# Patient Record
Sex: Female | Born: 2005 | Race: Black or African American | Hispanic: No | Marital: Single
Health system: Southern US, Community
[De-identification: ages and names within clinical notes are randomized; demographics above are authoritative.]

## PROBLEM LIST (undated history)

## (undated) DIAGNOSIS — Z889 Allergy status to unspecified drugs, medicaments and biological substances status: Secondary | ICD-10-CM

## (undated) DIAGNOSIS — L309 Dermatitis, unspecified: Secondary | ICD-10-CM

---
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
## 2023-04-18 ENCOUNTER — Ambulatory Visit (HOSPITAL_COMMUNITY)
Admission: EM | Admit: 2023-04-18 | Discharge: 2023-04-18 | Disposition: A | Discharge status: Home / Self Care | Payer: Medicaid Other | Attending: Family Medicine | Admitting: Family Medicine

## 2023-04-18 ENCOUNTER — Encounter (HOSPITAL_COMMUNITY): Payer: Self-pay | Admitting: Emergency Medicine

## 2023-04-18 ENCOUNTER — Other Ambulatory Visit: Payer: Self-pay

## 2023-04-18 DIAGNOSIS — L309 Dermatitis, unspecified: Secondary | ICD-10-CM | POA: Diagnosis not present

## 2023-04-18 DIAGNOSIS — G5601 Carpal tunnel syndrome, right upper limb: Secondary | ICD-10-CM | POA: Diagnosis not present

## 2023-04-18 NOTE — ED Provider Notes (Signed)
[facility]    CSN: [id number] Arrival date & time: 04/18/23  1229      History   Chief Complaint Chief Complaint  Patient presents with   Hand Pain    HPI Pamela Barr is a 17 y.o. female.    Hand Pain  Here for numbness and tingling in her right hand.  She woke up about 3 hours ago and had numbness in her right hand, and it did not include her right pinky.  Then pretty quickly it started improving and she had some intense tingling.  That is improving also and is now at about a 1 out of 10.  Sensation is improved.  No fever or rash no trauma.  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  She does not have a PCP currently    Past Medical History:  Diagnosis Date   Eczema    Multiple allergies     There are no problems to display for this patient.   History reviewed. No pertinent surgical history.  OB History   No obstetric history on file.      Home Medications    Prior to Admission medications   Medication Sig Start Date End Date Taking? Authorizing Provider  ferrous sulfate 325 (65 FE) MG tablet Take 1 tablet (325 mg total) by mouth daily. 09/30/21   Niel Hummer, MD  fluticasone (FLONASE) 50 MCG/ACT nasal spray Place 1 spray into both nostrils daily. 11/29/21   Haskins, Jaclyn Prime, NP  Loratadine 5 MG TBDP Take 1 tablet (5 mg total) by mouth daily. 01/02/12   Palumbo, April, MD    Family History Family History  Problem Relation Age of Onset   Diabetes Other    Cancer Neg Hx    Heart failure Neg Hx    Hyperlipidemia Neg Hx     Social History Social History   Tobacco Use   Smoking status: Passive Smoke Exposure - Never Smoker  Substance Use Topics   Alcohol use: No   Drug use: No     Allergies   Patient has no known allergies.   Review of Systems Review of Systems   Physical Exam Triage Vital Signs ED Triage Vitals  Encounter Vitals Group     BP  04/18/23 1319 (!) 99/53     Systolic BP Percentile --      Diastolic BP Percentile --      Pulse Rate 04/18/23 1319 67     Resp 04/18/23 1319 18     Temp 04/18/23 1319 98.5 F (36.9 C)     Temp Source 04/18/23 1319 Oral     SpO2 04/18/23 1319 97 %     Weight --      Height --      Head Circumference --      Peak Flow --      Pain Score 04/18/23 1321 1     Pain Loc --      Pain Education --      Exclude from Growth Chart --    No data found.  Updated Vital Signs BP (!) 99/53 (BP Location: Left Arm)   Pulse 67   Temp 98.5 F (36.9 C) (Oral)   Resp 18   LMP 04/13/2023   SpO2 97%   Visual Acuity Right Eye Distance:   Left Eye Distance:   Bilateral Distance:    Right Eye Near:  Left Eye Near:    Bilateral Near:     Physical Exam Vitals reviewed.  Constitutional:      General: She is not in acute distress.    Appearance: She is not ill-appearing, toxic-appearing or diaphoretic.  Eyes:     Extraocular Movements: Extraocular movements intact.     Pupils: Pupils are equal, round, and reactive to light.  Cardiovascular:     Rate and Rhythm: Normal rate and regular rhythm.     Heart sounds: No murmur heard. Pulmonary:     Effort: Pulmonary effort is normal.     Breath sounds: Normal breath sounds.  Musculoskeletal:     Cervical back: Neck supple.  Lymphadenopathy:     Cervical: No cervical adenopathy.  Skin:    Coloration: Skin is not pale.  Neurological:     Mental Status: She is alert and oriented to person, place, and time.     Comments: Only neurologic deficit is some very slight decreased light touch sensation over the right palm over the median distribution.  Grip is normal  Psychiatric:        Behavior: Behavior normal.      UC Treatments / Results  Labs (all labs ordered are listed, but only abnormal results are displayed) Labs Reviewed - No data to display  EKG   Radiology No results found.  Procedures Procedures (including critical care  time)  Medications Ordered in UC Medications - No data to display  Initial Impression / Assessment and Plan / UC Course  I have reviewed the triage vital signs and the nursing notes.  Pertinent labs & imaging results that were available during my care of the patient were reviewed by me and considered in my medical decision making (see chart for details).        I discussed with her that I think this is mild carpal tunnel syndrome she is experiencing.  Wrist brace is applied and she will wear it at night when asleep.  Also staff will help her in setting up primary care appointment  She is given contact information for dermatology Final Clinical Impressions(s) / UC Diagnoses   Final diagnoses:  Carpal tunnel syndrome of right wrist  Eczema, unspecified type     Discharge Instructions      Wear the wrist brace at night while you are sleeping  Follow-up with primary care about this issue       ED Prescriptions   None    PDMP not reviewed this encounter.   Zenia Resides, MD 04/18/23 [phone]

## 2023-04-18 NOTE — ED Triage Notes (Signed)
Patient presents to [facility] for evaluation of right hand numbness and pain this morning upon waking that lasted approximately 3 hours, but has subsided while waiting.

## 2023-04-18 NOTE — Discharge Instructions (Signed)
Wear the wrist brace at night while you are sleeping  Follow-up with primary care about this issue

## 2023-05-08 ENCOUNTER — Ambulatory Visit (HOSPITAL_COMMUNITY)
Admission: EM | Admit: 2023-05-08 | Discharge: 2023-05-08 | Disposition: A | Discharge status: Home / Self Care | Payer: Medicaid Other | Attending: Family Medicine | Admitting: Family Medicine

## 2023-05-08 ENCOUNTER — Encounter (HOSPITAL_COMMUNITY): Payer: Self-pay | Admitting: Emergency Medicine

## 2023-05-08 DIAGNOSIS — R21 Rash and other nonspecific skin eruption: Secondary | ICD-10-CM

## 2023-05-08 MED ORDER — CLOTRIMAZOLE-BETAMETHASONE 1-0.05 % EX CREA: TOPICAL_CREAM | CUTANEOUS | 0 refills | Status: DC

## 2023-05-08 NOTE — ED Provider Notes (Signed)
[facility]    CSN: [id number] Arrival date & time: 05/08/23  1952      History   Chief Complaint Chief Complaint  Patient presents with   Rash    HPI Pamela Barr is a 17 y.o. female.    Rash  Here for rash and itching on her abdomen, left shoulder and mid back.  Is been bothering her for a few days  No fever Past Medical History:  Diagnosis Date   Eczema    Multiple allergies     There are no problems to display for this patient.   History reviewed. No pertinent surgical history.  OB History   No obstetric history on file.      Home Medications    Prior to Admission medications   Medication Sig Start Date End Date Taking? Authorizing Provider  clotrimazole-betamethasone (LOTRISONE) cream Apply to affected area with rash 2 times daily till better 05/08/23  Yes Emojean Gertz, Janace Aris, MD    Family History Family History  Problem Relation Age of Onset   Diabetes Other    Cancer Neg Hx    Heart failure Neg Hx    Hyperlipidemia Neg Hx     Social History Social History   Tobacco Use   Smoking status: Passive Smoke Exposure - Never Smoker  Substance Use Topics   Alcohol use: No   Drug use: No     Allergies   Patient has no known allergies.   Review of Systems Review of Systems  Skin:  Positive for rash.     Physical Exam Triage Vital Signs ED Triage Vitals [05/08/23 2007]  Encounter Vitals Group     BP 109/69     Systolic BP Percentile      Diastolic BP Percentile      Pulse Rate 72     Resp 16     Temp 97.9 F (36.6 C)     Temp Source Oral     SpO2 98 %     Weight      Height      Head Circumference      Peak Flow      Pain Score 0     Pain Loc      Pain Education      Exclude from Growth Chart    No data found.  Updated Vital Signs BP 109/69 (BP Location: Left Arm)   Pulse 72   Temp 97.9 F (36.6 C) (Oral)   Resp 16   LMP 04/13/2023   SpO2 98%   Visual Acuity Right Eye Distance:   Left Eye  Distance:   Bilateral Distance:    Right Eye Near:   Left Eye Near:    Bilateral Near:     Physical Exam Vitals reviewed.  Constitutional:      General: She is not in acute distress.    Appearance: She is not toxic-appearing.  Skin:    Coloration: Skin is not pale.     Comments: There is a flat rash on her abdomen, left anterior shoulder and 1 spot on her back between her shoulder blades.  The spots are fairly scaly and flat and each are about 1/2 cm in diameter.  No ulceration and no herpetiform lesions  Neurological:     Mental Status: She is alert and oriented to person, place, and time.  Psychiatric:        Behavior: Behavior normal.      UC Treatments / Results  Labs (  all labs ordered are listed, but only abnormal results are displayed) Labs Reviewed - No data to display  EKG   Radiology No results found.  Procedures Procedures (including critical care time)  Medications Ordered in UC Medications - No data to display  Initial Impression / Assessment and Plan / UC Course  I have reviewed the triage vital signs and the nursing notes.  Pertinent labs & imaging results that were available during my care of the patient were reviewed by me and considered in my medical decision making (see chart for details).     Lotrisone is sent in to treat for possible tinea versus dermatitis.   Final Clinical Impressions(s) / UC Diagnoses   Final diagnoses:  Rash and nonspecific skin eruption     Discharge Instructions      Clotrimazole-betamethasone cream--Apply 2 times daily to the area where the rashes until better.    ED Prescriptions     Medication Sig Dispense Auth. Provider   clotrimazole-betamethasone (LOTRISONE) cream Apply to affected area with rash 2 times daily till better 45 g Marlinda Mike Janace Aris, MD      PDMP not reviewed this encounter.   Zenia Resides, MD 05/08/23 2026

## 2023-05-08 NOTE — Discharge Instructions (Signed)
Clotrimazole-betamethasone cream--Apply 2 times daily to the area where the rashes until better.

## 2023-05-08 NOTE — ED Triage Notes (Signed)
Pt reports rash that itches on abdomen. Tried cocoa butter. Has dermatology appt but not til September.

## 2023-06-09 ENCOUNTER — Ambulatory Visit: Payer: BC Managed Care – PPO | Admitting: Family Medicine

## 2023-08-13 ENCOUNTER — Ambulatory Visit: Payer: BC Managed Care – PPO | Admitting: Family Medicine

## 2023-08-13 ENCOUNTER — Encounter: Payer: Self-pay | Admitting: Family Medicine

## 2023-08-13 VITALS — BP 110/60 | HR 70 | Temp 97.5°F | Ht 63.0 in | Wt 160.4 lb

## 2023-08-13 DIAGNOSIS — Z23 Encounter for immunization: Secondary | ICD-10-CM

## 2023-08-13 DIAGNOSIS — L2082 Flexural eczema: Secondary | ICD-10-CM

## 2023-08-13 MED ORDER — TRIAMCINOLONE ACETONIDE 0.1 % EX CREA: 1.0000 | TOPICAL_CREAM | Freq: Two times a day (BID) | CUTANEOUS | 2 refills | Status: AC

## 2023-08-13 NOTE — Progress Notes (Signed)
New Patient Office Visit  Subjective    Patient ID: Pamela Barr, female    DOB: 24-Jan-2006  Age: 17 y.o. MRN: [id number]  CC:  Chief Complaint  Patient presents with   Establish Care    HPI Pamela Barr presents to establish care Patient is here to report a new symptom today. Patient state she is getting rashes on her skin, states that when she was little she had it in the creases of her arms and was told she had  eczema. States that it is starting to spread onto her face, eyelids, legs, wrists, hands, etc. Patient is using vaseline only currently. Was using cetaphil moisturizer in the past.   Patient denies any other chronic medical conditions, is not taking any medications, no major surgeries.   I have reviewed all aspects of the patient's medical history including social, family, and surgical history.   Current Outpatient Medications  Medication Instructions   triamcinolone cream (KENALOG) 0.1 % 1 Application, Topical, 2 times daily    Past Medical History:  Diagnosis Date   Eczema    Multiple allergies     History reviewed. No pertinent surgical history.  Family History  Problem Relation Age of Onset   Diabetes Paternal Uncle    Diabetes Other    Cancer Neg Hx    Heart failure Neg Hx    Hyperlipidemia Neg Hx     Social History   Socioeconomic History   Marital status: Single    Spouse name: Not on file   Number of children: Not on file   Years of education: Not on file   Highest education level: Not on file  Occupational History   Not on file  Tobacco Use   Smoking status: Passive Smoke Exposure - Never Smoker   Smokeless tobacco: Not on file  Vaping Use   Vaping status: Former  Substance and Sexual Activity   Alcohol use: Not Currently   Drug use: Not Currently   Sexual activity: Yes    Birth control/protection: None  Other Topics Concern   Not on file  Social History Narrative   Not on file   Social Determinants of Health    Financial Resource Strain: Not on file  Food Insecurity: Not on file  Transportation Needs: Not on file  Physical Activity: Not on file  Stress: Not on file  Social Connections: Not on file  Intimate Partner Violence: Not on file    Review of Systems  All other systems reviewed and are negative.       Objective    BP 110/60 (BP Location: Right Arm, Patient Position: Sitting, Cuff Size: Normal)   Pulse 70   Temp (!) 97.5 F (36.4 C) (Oral)   Ht 5\' 3"  (1.6 m)   Wt 160 lb 6.4 oz (72.8 kg)   LMP 08/01/2023 (Exact Date)   SpO2 99%   BMI 28.41 kg/m   Physical Exam Vitals reviewed.  Constitutional:      Appearance: Normal appearance. She is well-groomed and normal weight.  Eyes:     Conjunctiva/sclera: Conjunctivae normal.  Neck:     Thyroid: No thyromegaly.  Cardiovascular:     Rate and Rhythm: Normal rate and regular rhythm.     Pulses: Normal pulses.     Heart sounds: S1 normal and S2 normal.  Pulmonary:     Effort: Pulmonary effort is normal.     Breath sounds: Normal breath sounds and air entry.  Musculoskeletal:  Right lower leg: No edema.     Left lower leg: No edema.  Neurological:     Mental Status: She is alert and oriented to person, place, and time. Mental status is at baseline.     Gait: Gait is intact.  Psychiatric:        Mood and Affect: Mood and affect normal.        Speech: Speech normal.        Behavior: Behavior normal.        Judgment: Judgment normal.        Assessment & Plan:  Flexural eczema -     Triamcinolone Acetonide; Apply 1 Application topically 2 (two) times daily.  Dispense: 45 g; Refill: 2  Multiple areas of eczema and postinflammatory hyperpigmentation is noted, will order triamcinolone cream and I recommended mopisturizing multiple times per day. Will see her back in 6 months for annual exam. If no improvement then she may need referral to dermatology.   Return in about 6 months (around 02/10/2024) for annual physical  exam.   Karie Georges, MD

## 2023-12-15 ENCOUNTER — Ambulatory Visit (HOSPITAL_COMMUNITY): Admission: EM | Admit: 2023-12-15 | Discharge: 2023-12-15 | Disposition: A | Discharge status: Home / Self Care

## 2023-12-15 DIAGNOSIS — R21 Rash and other nonspecific skin eruption: Secondary | ICD-10-CM | POA: Diagnosis not present

## 2023-12-15 DIAGNOSIS — L239 Allergic contact dermatitis, unspecified cause: Secondary | ICD-10-CM | POA: Diagnosis not present

## 2023-12-15 NOTE — ED Notes (Signed)
 Patient called x1 for triage. No answer

## 2023-12-15 NOTE — ED Notes (Signed)
Patient called x 2 for triage.

## 2024-01-05 DIAGNOSIS — L209 Atopic dermatitis, unspecified: Secondary | ICD-10-CM | POA: Diagnosis not present

## 2024-01-05 DIAGNOSIS — J309 Allergic rhinitis, unspecified: Secondary | ICD-10-CM | POA: Diagnosis not present

## 2024-02-11 ENCOUNTER — Encounter: Payer: BC Managed Care – PPO | Admitting: Family Medicine

## 2024-08-28 ENCOUNTER — Encounter (HOSPITAL_COMMUNITY): Payer: Self-pay

## 2024-08-28 ENCOUNTER — Ambulatory Visit (HOSPITAL_COMMUNITY)
Admission: EM | Admit: 2024-08-28 | Discharge: 2024-08-28 | Disposition: A | Discharge status: Home / Self Care | Attending: Emergency Medicine | Admitting: Emergency Medicine

## 2024-08-28 DIAGNOSIS — K05219 Aggressive periodontitis, localized, unspecified severity: Secondary | ICD-10-CM

## 2024-08-28 DIAGNOSIS — K0530 Chronic periodontitis, unspecified: Secondary | ICD-10-CM

## 2024-08-28 MED ORDER — AMOXICILLIN-POT CLAVULANATE 875-125 MG PO TABS: 1.0000 | ORAL_TABLET | Freq: Two times a day (BID) | ORAL | 0 refills | Status: AC

## 2024-08-28 NOTE — ED Provider Notes (Addendum)
 [facility]    CSN: [id number] Arrival date & time: 08/28/24  1415    HISTORY   Chief Complaint  Patient presents with   Dental Pain   HPI Pamela Barr is a pleasant, 18 y.o. female who presents to urgent care today. Patient is here with her father today.  Patient states that this morning when she woke up she noticed that she was having pain in the soft tissue next to her left upper molar.  States she also noticed that the left upper aspect of her face was a little bit swollen.  Patient states she has had this issue twice in the past but has never been medically evaluated or treated.  States she has been able to pop the areas in the past which seems to resolve the issue.  Patient denies foul-smelling breath, dental pain, throat swelling, otalgia, facial pain, fever, pain with swallowing.  Patient has a temperature of 99 on arrival today with otherwise normal vital signs.  The history is provided by the patient.  Dental Pain  Past Medical History:  Diagnosis Date   Eczema    Multiple allergies    Patient Active Problem List   Diagnosis Date Noted   Flexural eczema 08/13/2023   History reviewed. No pertinent surgical history. OB History   No obstetric history on file.    Home Medications    Prior to Admission medications  Medication Sig Start Date End Date Taking? Authorizing Provider  triamcinolone  cream (KENALOG ) 0.1 % Apply 1 Application topically 2 (two) times daily. 08/13/23   Ozell Heron HERO, MD    Family History Family History  Problem Relation Age of Onset   Diabetes Paternal Uncle    Diabetes Other    Cancer Neg Hx    Heart failure Neg Hx    Hyperlipidemia Neg Hx    Social History Social History[1] Allergies   Patient has no known allergies.  Review of Systems Review of Systems Pertinent findings revealed after performing a 14 point review of systems has been noted in the history of present illness.  Physical Exam Vital  Signs BP (!) 107/47 (BP Location: Left Arm)   Pulse 68   Temp 99 F (37.2 C) (Oral)   Resp 14   LMP 08/02/2024 (Approximate)   SpO2 98%   No data found.  Physical Exam Vitals and nursing note reviewed.  Constitutional:      General: She is awake. She is not in acute distress.    Appearance: Normal appearance. She is well-developed and well-groomed. She is not ill-appearing.  HENT:     Head:      Right Ear: Hearing, tympanic membrane, ear canal and external ear normal.     Left Ear: Hearing, tympanic membrane, ear canal and external ear normal.     Nose: Nose normal.     Mouth/Throat:     Lips: Pink.     Mouth: Mucous membranes are moist.     Dentition: Gum lesions present. No gingival swelling, dental caries or dental abscesses.     Pharynx: Oropharynx is clear. Uvula midline. No pharyngeal swelling, posterior oropharyngeal erythema or uvula swelling.     Tonsils: 0 on the right. 0 on the left.   Neurological:     Mental Status: She is alert.  Psychiatric:        Behavior: Behavior is cooperative.     Visual Acuity Right Eye Distance:   Left Eye Distance:   Bilateral Distance:  Right Eye Near:   Left Eye Near:    Bilateral Near:     UC Couse / Diagnostics / Procedures:     Radiology No results found.  Procedures Procedures (including critical care time) EKG  Pending results:  Labs Reviewed - No data to display  Medications Ordered in UC: Medications - No data to display  UC Diagnoses / Final Clinical Impressions(s)   I have reviewed the triage vital signs and the nursing notes.  Pertinent labs & imaging results that were available during my care of the patient were reviewed by me and considered in my medical decision making (see chart for details).    Final diagnoses:  Acute periodontal abscess  Periodontitis   Physical exam findings concerning for peritonsillar abscess.  Patient provided with a 14-day course of Augmentin  for treatment.  Patient  advised drainage of the lesion is not something we can perform here in urgent care due to not having suction available.  Patient states she already has a dental appointment scheduled 2 days from now.  Patient advised can take ibuprofen  as needed for pain, rates her pain 2 out of 10 at this time, politely refuses offer for ibuprofen  prescription stating she has some at home already.  Patient also encouraged to apply warm compress to the side of her face a few times a day which will help improve circulation and deliver antibiotics to the area a little better.  Conservative care recommended.  Return precautions advised.  Please see discharge instructions below for details of plan of care as provided to patient. ED Prescriptions     Medication Sig Dispense Auth. Provider   amoxicillin -clavulanate (AUGMENTIN ) 875-125 MG tablet Take 1 tablet by mouth 2 (two) times daily for 14 days. 28 tablet Joesph Shaver Scales, PA-C      PDMP not reviewed this encounter.    Discharge Instructions      Please read below to learn more about the medications, dosages and frequencies that I recommend to help alleviate your symptoms and to get you feeling better soon:   Augmentin  (amoxicillin  - clavulanic acid):  Please take one (1) dose twice daily for 14 days.This antibiotic can cause upset stomach, this will resolve once antibiotics are complete.  You are welcome to take a probiotic, eat yogurt, take Imodium while taking this medication.  Please avoid other systemic medications such as Maalox, Pepto-Bismol or milk of magnesia as they can interfere with the body's ability to absorb the antibiotics.  As we discussed, if your symptoms have completely resolved for 10 days, you can discontinue this medication.  If any symptoms are lingering after taking Augmentin  for 10 days 10 days, please finish the full 14 days of treatment.   Advil , Motrin  (ibuprofen ): This is a good anti-inflammatory medication which addresses  aches, pains and inflammation of the upper airways that causes sinus and nasal congestion as well as in the lower airways which makes your cough feel tight and sometimes burn.  I recommend that you take 600 to 800 mg every 8 hours as needed.  Please do not take more than 2400 mg of ibuprofen  in a 24-hour period and please do not take high doses of ibuprofen  for more than 3 days in a row as this can lead to stomach ulcers.   Taking antibiotics can often cause patients to develop a vaginal yeast infection.  While taking the antibiotic prescribed for you today, or in the 7 to 10 days after you finish taking the antibiotic prescribed  for you today, if you develop symptoms of vaginal yeast infection including thick, white vaginal discharge and/or vaginal itching please contact us  at the phone number listed on the front of this after visit summary.  We will be happy to provide you with a prescription of Diflucan for empiric treatment of presumed antibiotic induced vaginal yeast infection.  You will not need to be seen first.   If symptoms have not meaningfully improved in the next 10 to 14 days, please return for repeat evaluation or follow-up with your regular provider.  If symptoms have worsened in the next 3 to 5 days, please go to the emergency room for further evaluation.    Thank you for visiting urgent care today.  We appreciate the opportunity to participate in your care.        Disposition Upon Discharge:  Condition: stable for discharge home  Patient presented with an acute illness with associated systemic symptoms and significant discomfort requiring urgent management. In my opinion, this is a condition that a prudent lay person (someone who possesses an average knowledge of health and medicine) may potentially expect to result in complications if not addressed urgently such as respiratory distress, impairment of bodily function or dysfunction of bodily organs.   Routine symptom specific,  illness specific and/or disease specific instructions were discussed with the patient and/or caregiver at length.   As such, the patient has been evaluated and assessed, work-up was performed and treatment was provided in alignment with urgent care protocols and evidence based medicine.  Patient/parent/caregiver has been advised that the patient may require follow up for further testing and treatment if the symptoms continue in spite of treatment, as clinically indicated and appropriate.  Patient/parent/caregiver has been advised to return to the [facility] or PCP if no better; to PCP or the Emergency Department if new signs and symptoms develop, or if the current signs or symptoms continue to change or worsen for further workup, evaluation and treatment as clinically indicated and appropriate  The patient will follow up with their current PCP if and as advised. If the patient does not currently have a PCP we will assist them in obtaining one.   The patient may need specialty follow up if the symptoms continue, in spite of conservative treatment and management, for further workup, evaluation, consultation and treatment as clinically indicated and appropriate.  Patient/parent/caregiver verbalized understanding and agreement of plan as discussed.  All questions were addressed during visit.  Please see discharge instructions below for further details of plan.  This office note has been dictated using Teaching laboratory technician.  Unfortunately, this method of dictation can sometimes lead to typographical or grammatical errors.  I apologize for your inconvenience in advance if this occurs.  Please do not hesitate to reach out to me if clarification is needed.      Joesph Shaver Scales, PA-C 08/28/24 1559     [1]  Social History Tobacco Use   Smoking status: Never    Passive exposure: Yes  Vaping Use   Vaping status: Every Day   Substances: Nicotine, Flavoring  Substance Use Topics    Alcohol use: Never   Drug use: Never     Joesph Shaver Scales, PA-C 08/28/24 1559

## 2024-08-28 NOTE — ED Triage Notes (Signed)
 Patient reports that she woke this AM with left upper dental pain and slight facial swelling. Patient has scheduled an appointment with a dentist for Monday.  Patient has not taken any medication for her dental pain.

## 2024-08-28 NOTE — Discharge Instructions (Signed)
 Please read below to learn more about the medications, dosages and frequencies that I recommend to help alleviate your symptoms and to get you feeling better soon:   Augmentin  (amoxicillin  - clavulanic acid):  Please take one (1) dose twice daily for 14 days.This antibiotic can cause upset stomach, this will resolve once antibiotics are complete.  You are welcome to take a probiotic, eat yogurt, take Imodium while taking this medication.  Please avoid other systemic medications such as Maalox, Pepto-Bismol or milk of magnesia as they can interfere with the body's ability to absorb the antibiotics.  As we discussed, if your symptoms have completely resolved for 10 days, you can discontinue this medication.  If any symptoms are lingering after taking Augmentin  for 10 days 10 days, please finish the full 14 days of treatment.   Advil , Motrin  (ibuprofen ): This is a good anti-inflammatory medication which addresses aches, pains and inflammation of the upper airways that causes sinus and nasal congestion as well as in the lower airways which makes your cough feel tight and sometimes burn.  I recommend that you take 600 to 800 mg every 8 hours as needed.  Please do not take more than 2400 mg of ibuprofen  in a 24-hour period and please do not take high doses of ibuprofen  for more than 3 days in a row as this can lead to stomach ulcers.   Taking antibiotics can often cause patients to develop a vaginal yeast infection.  While taking the antibiotic prescribed for you today, or in the 7 to 10 days after you finish taking the antibiotic prescribed for you today, if you develop symptoms of vaginal yeast infection including thick, white vaginal discharge and/or vaginal itching please contact us  at the phone number listed on the front of this after visit summary.  We will be happy to provide you with a prescription of Diflucan for empiric treatment of presumed antibiotic induced vaginal yeast infection.  You will not need to  be seen first.   If symptoms have not meaningfully improved in the next 10 to 14 days, please return for repeat evaluation or follow-up with your regular provider.  If symptoms have worsened in the next 3 to 5 days, please go to the emergency room for further evaluation.    Thank you for visiting urgent care today.  We appreciate the opportunity to participate in your care.
